# Patient Record
Sex: Female | Born: 1990
Health system: Southern US, Community
[De-identification: ages and names within clinical notes are randomized; demographics above are authoritative.]

## PROBLEM LIST (undated history)

## (undated) ENCOUNTER — Inpatient Hospital Stay (HOSPITAL_COMMUNITY): Payer: Self-pay

## (undated) DIAGNOSIS — Z789 Other specified health status: Secondary | ICD-10-CM

## (undated) HISTORY — DX: Other specified health status: Z78.9

## (undated) HISTORY — PX: CARDIAC SURGERY: SHX584

---
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
## 2012-01-02 ENCOUNTER — Ambulatory Visit (INDEPENDENT_AMBULATORY_CARE_PROVIDER_SITE_OTHER): Payer: 59 | Admitting: Family

## 2012-01-02 VITALS — BP 129/79 | Wt 143.0 lb

## 2012-01-02 DIAGNOSIS — Z3480 Encounter for supervision of other normal pregnancy, unspecified trimester: Secondary | ICD-10-CM

## 2012-01-02 NOTE — Progress Notes (Signed)
No questions or concerns;  Irregular contractions; labor precautions; growth ultrasound for persistent fundal height

## 2012-01-02 NOTE — Progress Notes (Signed)
Routine prenatal check.

## 2012-01-03 ENCOUNTER — Encounter (HOSPITAL_COMMUNITY): Payer: Self-pay | Admitting: *Deleted

## 2012-01-03 ENCOUNTER — Inpatient Hospital Stay (HOSPITAL_COMMUNITY)
Admission: AD | Admit: 2012-01-03 | Discharge: 2012-01-05 | DRG: 775 | Disposition: A | Discharge status: Home / Self Care | Payer: 59 | Source: Ambulatory Visit | Attending: Obstetrics & Gynecology | Admitting: Obstetrics & Gynecology

## 2012-01-03 DIAGNOSIS — IMO0001 Reserved for inherently not codable concepts without codable children: Secondary | ICD-10-CM

## 2012-01-03 DIAGNOSIS — Z3490 Encounter for supervision of normal pregnancy, unspecified, unspecified trimester: Secondary | ICD-10-CM

## 2012-01-03 NOTE — MAU Note (Signed)
SAYS HURT BAD AT 2230.   THINKS SROM AT 2220- CLEAR.   VE - YESTERDAY- 2 CM.  DENIES HSV AND MRSA.

## 2012-01-04 ENCOUNTER — Encounter (HOSPITAL_COMMUNITY): Payer: Self-pay | Admitting: *Deleted

## 2012-01-04 ENCOUNTER — Inpatient Hospital Stay (HOSPITAL_COMMUNITY): Payer: 59 | Admitting: Anesthesiology

## 2012-01-04 ENCOUNTER — Ambulatory Visit (HOSPITAL_COMMUNITY): Admission: RE | Admit: 2012-01-04 | Payer: 59 | Source: Ambulatory Visit

## 2012-01-04 ENCOUNTER — Encounter (HOSPITAL_COMMUNITY): Payer: Self-pay | Admitting: Anesthesiology

## 2012-01-04 LAB — RPR: RPR Ser Ql: NONREACTIVE

## 2012-01-04 LAB — CBC
HCT: 35.7 % — ABNORMAL LOW (ref 36.0–46.0)
Hemoglobin: 12.2 g/dL (ref 12.0–15.0)
MCH: 29.8 pg (ref 26.0–34.0)
MCHC: 34.2 g/dL (ref 30.0–36.0)
MCV: 87.1 fL (ref 78.0–100.0)
Platelets: 242 10*3/uL (ref 150–400)
RBC: 4.1 MIL/uL (ref 3.87–5.11)
RDW: 14 % (ref 11.5–15.5)
WBC: 11.9 10*3/uL — ABNORMAL HIGH (ref 4.0–10.5)

## 2012-01-04 MED ORDER — LACTATED RINGERS IV SOLN: 500.0000 mL | Freq: Once | INTRAVENOUS | Status: DC

## 2012-01-04 MED ORDER — ONDANSETRON HCL 4 MG/2ML IJ SOLN: 4.0000 mg | INTRAMUSCULAR | Status: DC | PRN

## 2012-01-04 MED ORDER — PHENYLEPHRINE 40 MCG/ML (10ML) SYRINGE FOR IV PUSH (FOR BLOOD PRESSURE SUPPORT)
80.0000 ug | PREFILLED_SYRINGE | INTRAVENOUS | Status: DC | PRN
  Filled 2012-01-04: qty 5

## 2012-01-04 MED ORDER — LACTATED RINGERS IV SOLN
INTRAVENOUS | Status: DC
  Administered 2012-01-04 (×3): via INTRAVENOUS

## 2012-01-04 MED ORDER — BENZOCAINE-MENTHOL 20-0.5 % EX AERO
1.0000 "application " | INHALATION_SPRAY | CUTANEOUS | Status: DC | PRN
  Administered 2012-01-04: 1 via TOPICAL
  Filled 2012-01-04: qty 56

## 2012-01-04 MED ORDER — SIMETHICONE 80 MG PO CHEW: 80.0000 mg | CHEWABLE_TABLET | ORAL | Status: DC | PRN

## 2012-01-04 MED ORDER — SODIUM BICARBONATE 8.4 % IV SOLN
INTRAVENOUS | Status: DC | PRN
  Administered 2012-01-04: 3 mL via EPIDURAL

## 2012-01-04 MED ORDER — CITRIC ACID-SODIUM CITRATE 334-500 MG/5ML PO SOLN: 30.0000 mL | ORAL | Status: DC | PRN

## 2012-01-04 MED ORDER — OXYTOCIN 40 UNITS IN LACTATED RINGERS INFUSION - SIMPLE MED
62.5000 mL/h | Freq: Once | INTRAVENOUS | Status: DC
  Filled 2012-01-04: qty 1000

## 2012-01-04 MED ORDER — WITCH HAZEL-GLYCERIN EX PADS: 1.0000 "application " | MEDICATED_PAD | CUTANEOUS | Status: DC | PRN

## 2012-01-04 MED ORDER — ZOLPIDEM TARTRATE 5 MG PO TABS: 5.0000 mg | ORAL_TABLET | Freq: Every evening | ORAL | Status: DC | PRN

## 2012-01-04 MED ORDER — EPHEDRINE 5 MG/ML INJ: 10.0000 mg | INTRAVENOUS | Status: DC | PRN

## 2012-01-04 MED ORDER — LIDOCAINE HCL (PF) 1 % IJ SOLN
30.0000 mL | INTRAMUSCULAR | Status: DC | PRN
  Filled 2012-01-04: qty 30

## 2012-01-04 MED ORDER — LACTATED RINGERS IV SOLN: 500.0000 mL | INTRAVENOUS | Status: DC | PRN

## 2012-01-04 MED ORDER — FENTANYL 2.5 MCG/ML BUPIVACAINE 1/10 % EPIDURAL INFUSION (WH - ANES)
INTRAMUSCULAR | Status: DC | PRN
  Administered 2012-01-04: 12 mL/h via EPIDURAL

## 2012-01-04 MED ORDER — OXYCODONE-ACETAMINOPHEN 5-325 MG PO TABS: 1.0000 | ORAL_TABLET | ORAL | Status: DC | PRN

## 2012-01-04 MED ORDER — ONDANSETRON HCL 4 MG PO TABS: 4.0000 mg | ORAL_TABLET | ORAL | Status: DC | PRN

## 2012-01-04 MED ORDER — IBUPROFEN 600 MG PO TABS
600.0000 mg | ORAL_TABLET | Freq: Four times a day (QID) | ORAL | Status: DC
  Administered 2012-01-04 – 2012-01-05 (×5): 600 mg via ORAL
  Filled 2012-01-04 (×5): qty 1

## 2012-01-04 MED ORDER — ACETAMINOPHEN 325 MG PO TABS: 650.0000 mg | ORAL_TABLET | ORAL | Status: DC | PRN

## 2012-01-04 MED ORDER — OXYTOCIN BOLUS FROM INFUSION
250.0000 mL | Freq: Once | INTRAVENOUS | Status: AC
  Administered 2012-01-04: 250 mL via INTRAVENOUS
  Filled 2012-01-04: qty 500

## 2012-01-04 MED ORDER — ONDANSETRON HCL 4 MG/2ML IJ SOLN
4.0000 mg | Freq: Four times a day (QID) | INTRAMUSCULAR | Status: DC | PRN
  Administered 2012-01-04: 4 mg via INTRAVENOUS
  Filled 2012-01-04: qty 2

## 2012-01-04 MED ORDER — FENTANYL 2.5 MCG/ML BUPIVACAINE 1/10 % EPIDURAL INFUSION (WH - ANES)
14.0000 mL/h | INTRAMUSCULAR | Status: DC
  Administered 2012-01-04: 14 mL/h via EPIDURAL
  Filled 2012-01-04 (×2): qty 60

## 2012-01-04 MED ORDER — TETANUS-DIPHTH-ACELL PERTUSSIS 5-2.5-18.5 LF-MCG/0.5 IM SUSP: 0.5000 mL | Freq: Once | INTRAMUSCULAR | Status: DC

## 2012-01-04 MED ORDER — LANOLIN HYDROUS EX OINT: TOPICAL_OINTMENT | CUTANEOUS | Status: DC | PRN

## 2012-01-04 MED ORDER — SENNOSIDES-DOCUSATE SODIUM 8.6-50 MG PO TABS
2.0000 | ORAL_TABLET | Freq: Every day | ORAL | Status: DC
  Administered 2012-01-04: 2 via ORAL

## 2012-01-04 MED ORDER — DIPHENHYDRAMINE HCL 25 MG PO CAPS: 25.0000 mg | ORAL_CAPSULE | Freq: Four times a day (QID) | ORAL | Status: DC | PRN

## 2012-01-04 MED ORDER — IBUPROFEN 600 MG PO TABS: 600.0000 mg | ORAL_TABLET | Freq: Four times a day (QID) | ORAL | Status: DC | PRN

## 2012-01-04 MED ORDER — EPHEDRINE 5 MG/ML INJ
10.0000 mg | INTRAVENOUS | Status: DC | PRN
  Filled 2012-01-04: qty 4

## 2012-01-04 MED ORDER — DIBUCAINE 1 % RE OINT: 1.0000 "application " | TOPICAL_OINTMENT | RECTAL | Status: DC | PRN

## 2012-01-04 MED ORDER — FLEET ENEMA 7-19 GM/118ML RE ENEM: 1.0000 | ENEMA | RECTAL | Status: DC | PRN

## 2012-01-04 MED ORDER — PHENYLEPHRINE 40 MCG/ML (10ML) SYRINGE FOR IV PUSH (FOR BLOOD PRESSURE SUPPORT): 80.0000 ug | PREFILLED_SYRINGE | INTRAVENOUS | Status: DC | PRN

## 2012-01-04 MED ORDER — PRENATAL MULTIVITAMIN CH
1.0000 | ORAL_TABLET | Freq: Every day | ORAL | Status: DC
  Administered 2012-01-04 – 2012-01-05 (×2): 1 via ORAL
  Filled 2012-01-04 (×2): qty 1

## 2012-01-04 MED ORDER — DIPHENHYDRAMINE HCL 50 MG/ML IJ SOLN: 12.5000 mg | INTRAMUSCULAR | Status: DC | PRN

## 2012-01-04 MED ORDER — NALBUPHINE SYRINGE 5 MG/0.5 ML: 10.0000 mg | INJECTION | INTRAMUSCULAR | Status: DC | PRN

## 2012-01-04 NOTE — H&P (Signed)
Attestation of Attending Supervision of Advanced Practitioner (CNM/NP): Evaluation and management procedures were performed by the Advanced Practitioner under my supervision and collaboration.  I have reviewed the Advanced Practitioner's note and chart, and I agree with the management and plan.  HARRAWAY-SMITH, Edi Gorniak 4:26 AM

## 2012-01-04 NOTE — H&P (Signed)
Adonai Helzer is a 21 y.o. female presenting for eval of ctx and possibly leaking this evening. Denies bldg, reports +FM. Her preg has been followed by the [facility] office and has been essentially unremarkable. Denies H/A, visual disturbances or RUQ pain. History OB History    Grav Para Term Preterm Abortions TAB SAB Ect Mult Living   1              Past Medical History  Diagnosis Date  . No pertinent past medical history    Past Surgical History  Procedure Date  . Cardiac surgery     aas a baby for a hole in the heart   Family History: family history includes Cancer in her father and mother; Diabetes in her father and mother; and Hypertension in her father and mother. Social History:  reports that she has quit smoking. She has never used smokeless tobacco. She reports that she does not drink alcohol or use illicit drugs.   Prenatal Transfer Tool  Maternal Diabetes: No Genetic Screening: Declined Maternal Ultrasounds/Referrals: Normal Fetal Ultrasounds or other Referrals:  None Maternal Substance Abuse:  No Significant Maternal Medications:  None Significant Maternal Lab Results:  Lab values include: Group B Strep negative Other Comments:  None  ROS  Dilation: 4 Effacement (%): 100 Exam by:: Philipp Deputy CNM Blood pressure 156/105, pulse 65, temperature 97.9 F (36.6 C), temperature source Oral, resp. rate 18, height 5\' 3"  (1.6 m), weight 65.431 kg (144 lb 4 oz), last menstrual period 03/18/2011. Maternal Exam:  Uterine Assessment: Ctx q 2-3 mins, mod  Cervix: Cervix evaluated by sterile speculum exam and digital exam.   SSE shows neg pool and neg fern; + copious cervical mucous present Cx 4/100/vtx -4, + memb palp  Physical Exam  Constitutional: She is oriented to person, place, and time. She appears well-developed.  HENT:  Head: Normocephalic.  Cardiovascular: Normal rate.   Respiratory: Effort normal.  Genitourinary: Vagina normal.  Musculoskeletal: Normal  range of motion.  Neurological: She is alert and oriented to person, place, and time.  Skin: Skin is warm and dry.  Psychiatric: She has a normal mood and affect. Her behavior is normal. Thought content normal.    Prenatal labs: ABO, Rh: A/POS/-- (12/05 1456) Antibody: NEG (12/05 1456) Rubella: 75.8 (12/05 1456) RPR: NON REAC (05/10 1130)  HBsAg: NEGATIVE (12/05 1456)  HIV: NON REACTIVE (12/05 1456)  GBS:     Assessment/Plan: IUP at 40.0wks Early labor Elevated BPs  Admit to L&D In light of BP yesterday of 127/70's and no s/s preeclampsia, will reeval BPs once more comfortable and obtain labs prn Expectant management Plans epidural Anticipate SVD   SHAW, KIMBERLY 01/04/2012, 12:49 AM

## 2012-01-04 NOTE — Anesthesia Preprocedure Evaluation (Signed)

## 2012-01-04 NOTE — Anesthesia Procedure Notes (Signed)

## 2012-01-05 MED ORDER — IBUPROFEN 600 MG PO TABS: 600.0000 mg | ORAL_TABLET | Freq: Four times a day (QID) | ORAL | Status: AC

## 2012-01-05 NOTE — Discharge Summary (Signed)
Obstetric Discharge Summary Reason for Admission: onset of labor Prenatal Procedures: none Intrapartum Procedures: spontaneous vaginal delivery Postpartum Procedures: none Complications-Operative and Postpartum: 1st degree perineal laceration Hemoglobin  Date Value Range Status  01/04/2012 12.2  12.0 - 15.0 g/dL Final     HCT  Date Value Range Status  01/04/2012 35.7* 36.0 - 46.0 % Final    Physical Exam:  General: alert and cooperative Lochia: appropriate Uterine Fundus: firm Incision: n/a DVT Evaluation: No evidence of DVT seen on physical exam. Negative Homan's sign. No cords or calf tenderness. No significant calf/ankle edema.  Discharge Diagnoses: Term Pregnancy-delivered  Discharge Information: Date: 01/05/2012 Activity: unrestricted Diet: routine Medications: PNV and Ibuprofen Condition: stable Instructions: refer to practice specific booklet Discharge to: home Follow-up Information    Schedule an appointment as soon as possible for a visit with [facility].   Contact information:   [address], [address] [place] [address] [phone]         Newborn Data: Live born female  Birth Weight: 6 lb 9.8 oz (3000 g) APGAR: 9, 9  Home with mother.  Ashley Gilbert 01/05/2012, 7:15 AM  I have seen and examined this patient and I agree with the above. [name] 7:47 AM 01/05/2012

## 2012-01-05 NOTE — Progress Notes (Signed)
Sw referral received to answer "insurance questions." This Sw told the pt to apply for Medicaid at the [employer] in [place]. Pt has private insurance but wanted to know how to get insurance for the infant. Pt and FOB thanked Sw for information.   

## 2012-01-05 NOTE — Anesthesia Postprocedure Evaluation (Signed)
  Anesthesia Post-op Note  Patient: Ashley Gilbert  Procedure(s) Performed: * No procedures listed *  Patient Location: PACU and Mother/Baby  Anesthesia Type: Epidural  Level of Consciousness: awake, alert  and oriented  Airway and Oxygen Therapy: Patient Spontanous Breathing  Post-op Pain: none  Post-op Assessment: Post-op Vital signs reviewed and Patient's Cardiovascular Status Stable  Post-op Vital Signs: Reviewed and stable  Complications: No apparent anesthesia complications

## 2012-01-06 NOTE — Progress Notes (Signed)
Post discharge chart review completed.  

## 2012-01-09 ENCOUNTER — Encounter: Payer: 59 | Admitting: Obstetrics and Gynecology

## 2012-01-09 NOTE — Discharge Summary (Signed)
Discharge as documented above.

## 2012-02-14 ENCOUNTER — Ambulatory Visit (INDEPENDENT_AMBULATORY_CARE_PROVIDER_SITE_OTHER): Payer: 59 | Admitting: Obstetrics & Gynecology

## 2012-02-14 ENCOUNTER — Encounter: Payer: Self-pay | Admitting: Obstetrics & Gynecology

## 2012-02-14 DIAGNOSIS — Z3009 Encounter for other general counseling and advice on contraception: Secondary | ICD-10-CM

## 2012-02-14 DIAGNOSIS — IMO0002 Reserved for concepts with insufficient information to code with codable children: Secondary | ICD-10-CM

## 2012-02-14 DIAGNOSIS — O99345 Other mental disorders complicating the puerperium: Secondary | ICD-10-CM

## 2012-02-14 DIAGNOSIS — F53 Postpartum depression: Secondary | ICD-10-CM | POA: Insufficient documentation

## 2012-02-14 LAB — POCT URINE PREGNANCY: Preg Test, Ur: NEGATIVE

## 2012-02-14 MED ORDER — SERTRALINE HCL 50 MG PO TABS: 50.0000 mg | ORAL_TABLET | Freq: Every day | ORAL | Status: DC

## 2012-02-14 MED ORDER — SERTRALINE HCL 50 MG PO TABS: 50.0000 mg | ORAL_TABLET | Freq: Every day | ORAL | Status: AC

## 2012-02-14 NOTE — Progress Notes (Signed)
  Subjective:     Ashley Gilbert is a 21 y.o. female who presents for a postpartum visit. She is 6 weeks postpartum following a spontaneous vaginal delivery. I have fully reviewed the prenatal and intrapartum course. The delivery was at 40 gestational weeks. Outcome: spontaneous vaginal delivery. Anesthesia: epidural. Postpartum course has been very good except depression.  Pt has an anxiety in the past.  . Baby's course has been "good". Baby is feeding by bottle - [name]. Bleeding bleeding after unprotected sex at 5 weeks pp. Bowel function is normal. Bladder function is normal. Patient is sexually active. Contraception method is coitus interruptus. Postpartum depression screening: positive.  The following portions of the patient's history were reviewed and updated as appropriate: allergies, current medications, past family history, past medical history, past social history, past surgical history and problem list.  Review of Systems Musculoskeletal:positive for back pain   Objective:    Breastfeeding? Unknown  General:  alert, cooperative and no distress   Breasts:  inspection negative, no nipple discharge or bleeding, no masses or nodularity palpable  Lungs: clear to auscultation bilaterally  Heart:  regular rate and rhythm  Abdomen: soft, non-tender; bowel sounds normal; no masses,  no organomegaly   Vulva:  positive for bump on right labia majora, will look at in 2 weeks when placing IUD.  Does not appear to be HSV.  Nontender.  Vagina: normal vagina  Cervix:  no cervical motion tenderness  Corpus: normal size, contour, position, consistency, mobility, non-tender  Adnexa:  no mass, fullness, tenderness  Rectal Exam: Not performed.        Assessment:    nml postpartum exam. Pt agrees to Zoloft for post partum depression.  If Zoloft doesn't work will refer to pscyh due to anxiety..   Plan:    1. Contraception: IUD 2. Has had unprotected sex so will do pregnancy test today,  abstinence for 2 weeks, then IUD Mirena. 3. Follow up in: 2 weeks or as needed.

## 2012-02-24 ENCOUNTER — Ambulatory Visit: Payer: 59

## 2012-02-27 ENCOUNTER — Ambulatory Visit: Payer: 59 | Admitting: Advanced Practice Midwife

## 2013-02-18 IMAGING — US US OB DETAIL+14 WK
1 series · 16 of 28 positions shown · non-contrast
Comparison: none

[Series 1: us ob detail +14 wk · 16 of 80 slices shown]
[im 1/80]
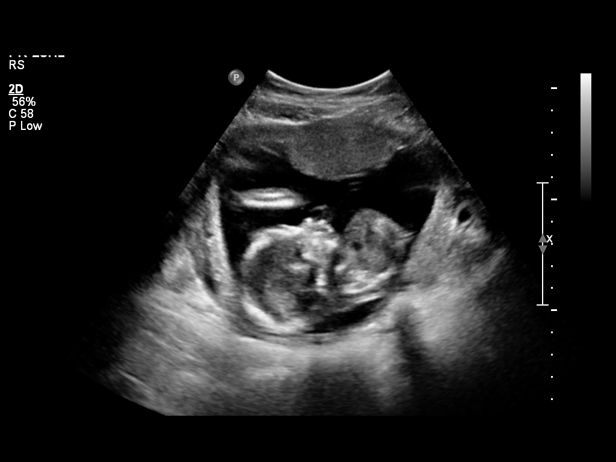
[im 6/80]
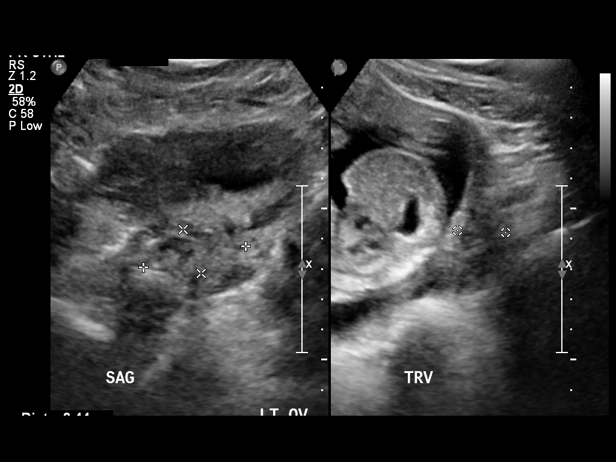
[im 12/80]
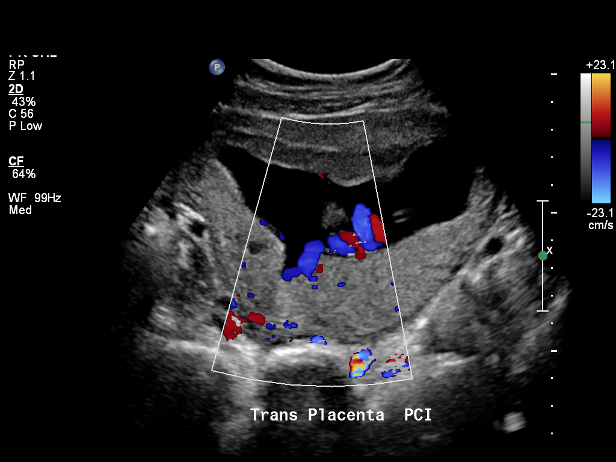
[im 18/80]
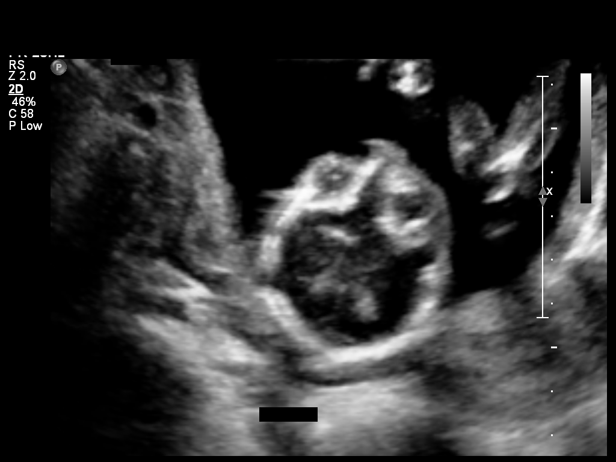
[im 21/80]
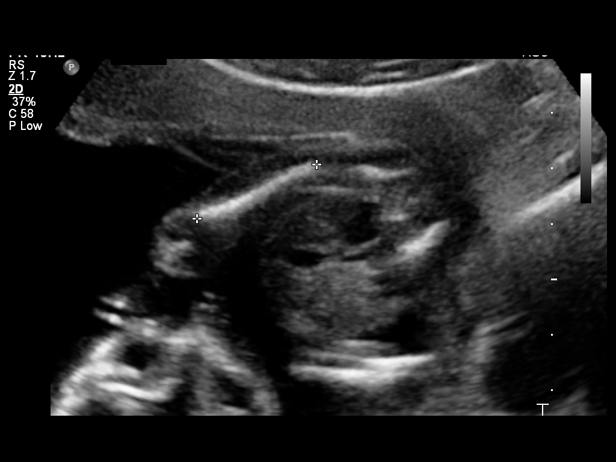
[im 27/80]
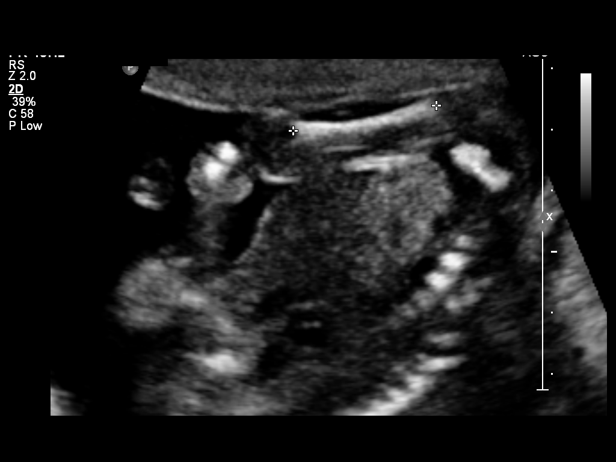
[im 33/80]
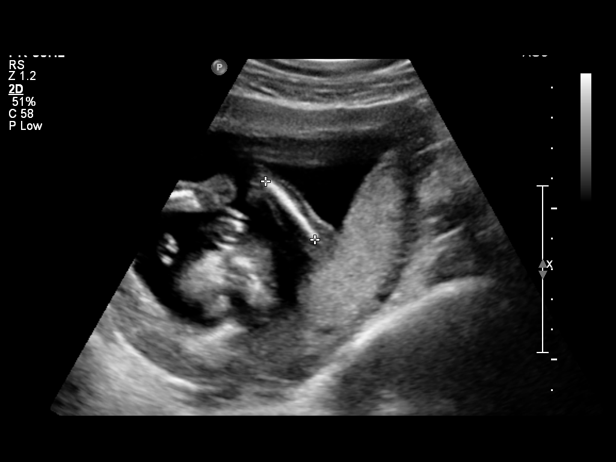
[im 39/80]
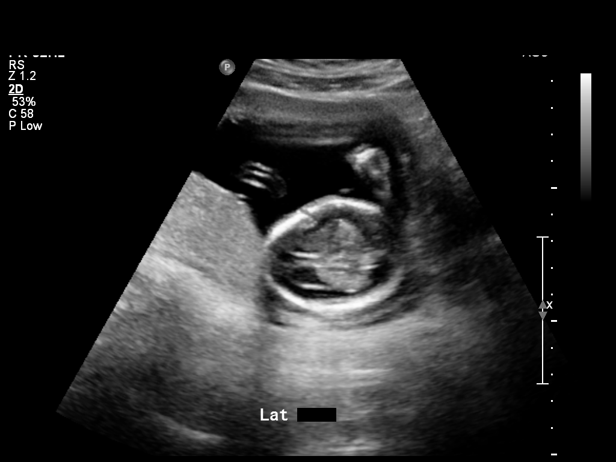
[im 41/80]
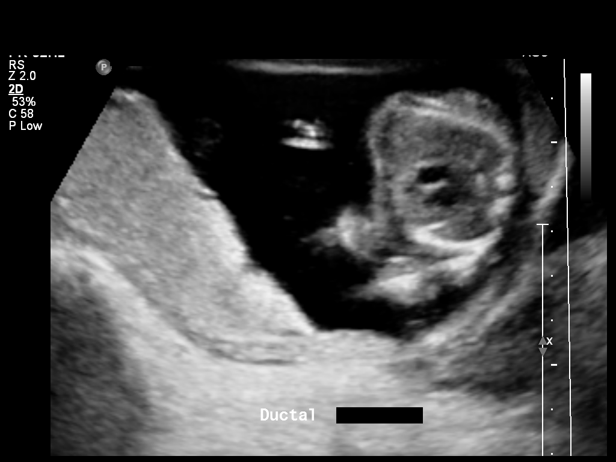
[im 47/80]
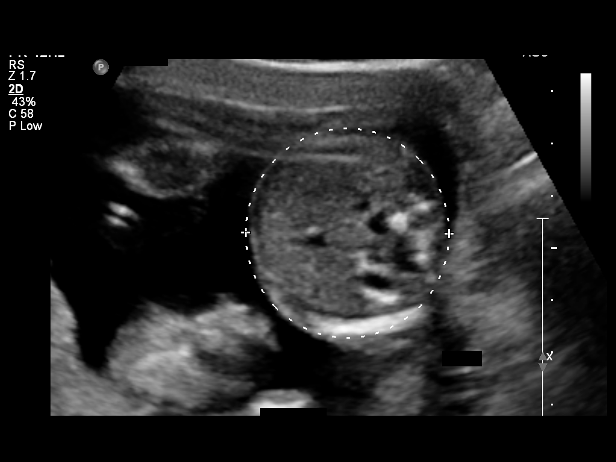
[im 53/80]
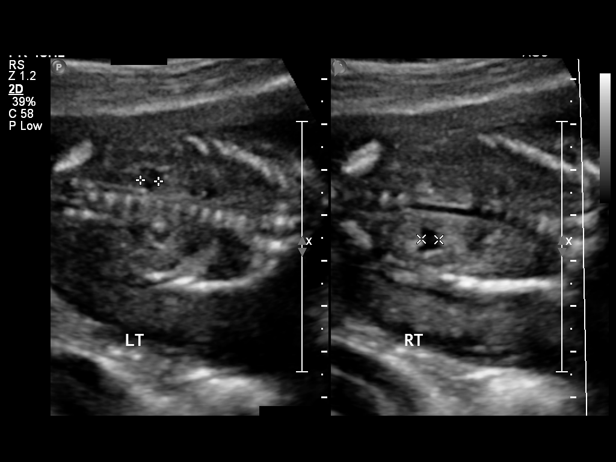
[im 59/80]
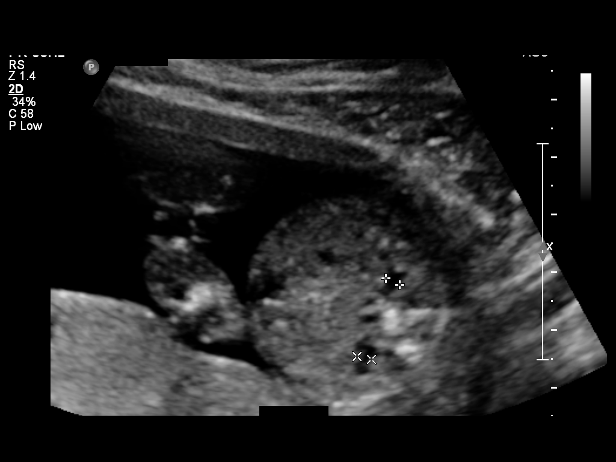
[im 62/80]
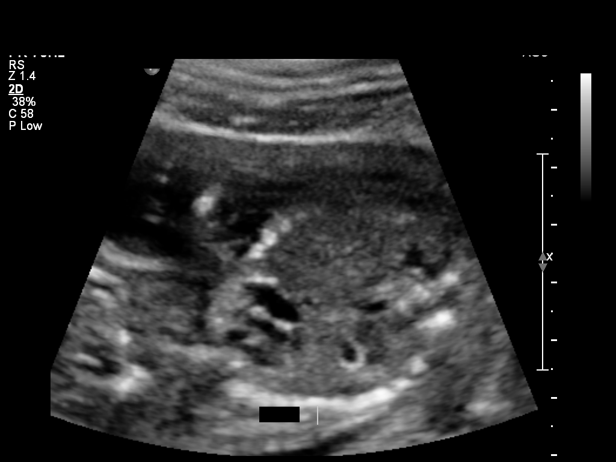
[im 68/80]
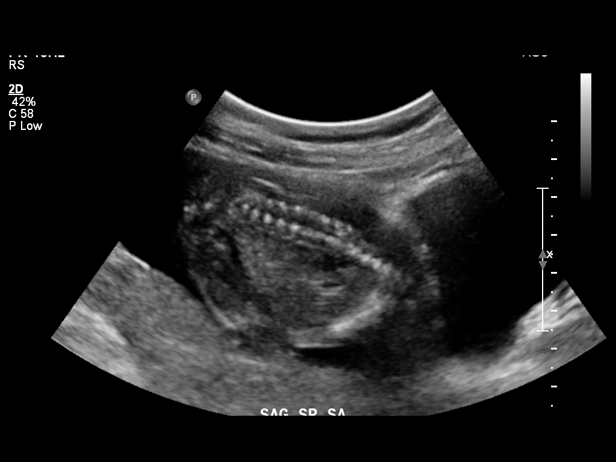
[im 74/80]
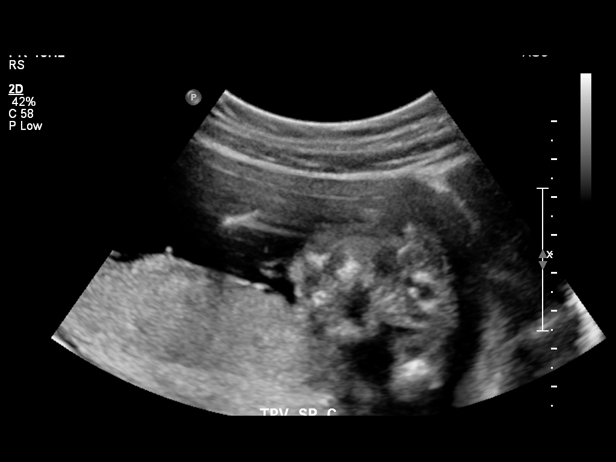
[im 80/80]
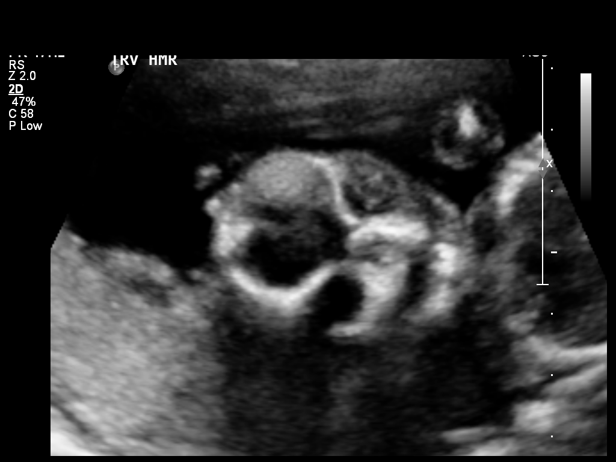

[16 of 28 positions shown; findings below may reference images not displayed]

OBSTETRICS REPORT
                      (Signed Final 08/02/2011 [DATE])

                 CNM
 Order#:         [id number]
Procedures

 US OB DETAIL + 14 WK                                  76811.0
Indications

 Detailed fetal anatomic survey
Fetal Evaluation

 Fetal Heart Rate:  163                         bpm
 Cardiac Activity:  Observed
 Presentation:      Variable
 Placenta:          Fundal, above cervical os
 P. Cord            Visualized, central
 Insertion:

 Amniotic Fluid
 AFI FV:      Subjectively within normal limits
                                             Larg Pckt:   3.53   cm
Biometry

 BPD:     39.5  mm    G. Age:   18w 0d                CI:        72.04   70 - 86
                                                      FL/HC:      16.6   15.8 -
                                                                         18
 HC:     148.1  mm    G. Age:   17w 6d       46  %    HC/AC:      1.20   1.07 -

 AC:     123.8  mm    G. Age:   18w 0d       53  %    FL/BPD:
 FL:      24.6  mm    G. Age:   17w 3d       29  %    FL/AC:      19.9   20 - 24
 HUM:     24.9  mm    G. Age:   17w 6d       53  %
 CER:     17.7  mm    G. Age:   17w 5d       46  %
 NFT:     3.97  mm

 Est. FW:     209  gm      0 lb 7 oz     48  %
Gestational Age

 LMP:           19w 4d       Date:   03/18/11                 EDD:   12/23/11
 U/S Today:     17w 6d                                        EDD:   01/04/12
 Best:          17w 6d    Det. By:   U/S (08/02/11)           EDD:   01/04/12
Genetic Sonogram - Trisomy 21 Screening
 Age:                                             20          Risk=1:   885
 Echogenic bowel:                                 No          LR :
 Hypoplastic/absent Nasal bone:                   No
 Choroid plexus cysts:                            No
 Structural anomalies (inc. cardiac):             No          LR :
 Hypoplastic / absent midphalanx 5th Digit:       No
 Short femur:                                     No          LR :
 Wide space 4st-0nd toes:                         No
 Short humerus:                                   No          LR :
 2-vessel umbilical cord:                         No
 Pyelectasis:                                     No          LR :
 Echogenic cardiac foci:                          No          LR :
 Nuchal fold thickening >= 6 mm:                  No          LR :

 12 Of 12 Criteria Were Visualized and 0 Abnormal(s) Were Seen.
 Ultrasound Modified Risk for Fetal Down Syndrome = [DATE]
Anatomy

 Cranium:           Appears normal      Aortic Arch:       Not well
                                                           visualized
 Fetal Cavum:       Appears normal      Ductal Arch:       Not well
                                                           visualized
 Ventricles:        Appears normal      Diaphragm:         Appears normal
 Choroid Plexus:    Appears normal      Stomach:           Appears normal
 Cerebellum:        Appears normal      Abdomen:           Appears normal
 Posterior Fossa:   Appears normal      Abdominal Wall:    Appears nml
                                                           (cord insert,
                                                           abd wall)
 Nuchal Fold:       Appears normal      Cord Vessels:      Appears normal
                    (neck, nuchal                          (3 vessel cord)
                    fold)
 Face:              Appears normal      Kidneys:           Appear normal
                    (lips/profile/orbit
                    s)
 Heart:             Appears normal      Bladder:           Appears normal
                    (4 chamber &
                    axis)
 RVOT:              Appears normal      Spine:             Appears normal
 LVOT:              Appears normal      Limbs:             Appears normal
                                                           (hands, ankles,
                                                           feet)

 Other:     Fetus appears to be a female. Heels and 5th digit
            visualized.
Targeted Anatomy

 Fetal Central Nervous System
 Lat. Ventricles:   5.2                 Cisterna Magna:
Cervix Uterus Adnexa

 Cervical Length:   4         cm

 Cervix:       Normal appearance by transabdominal scan.

 Left Ovary:   Normal, measuring 3.4 x 1.6 x 1.6cm.
 Right Ovary:  Not visualized.
 Adnexa:     No abnormality visualized.
Impression

  Single living intrauterine gestation with concordant  fetal
 indices and normal visualized anatomy. Today's sonographic
 EGA is 12 days younger than estimated by LMP.  No
 sonographic markers for aneuploidy visualized;  see above
 for US modified Trisomy 21 risk calculation.

## 2013-06-18 IMAGING — US US OB FOLLOW-UP
1 series · 12 of 28 positions shown · non-contrast
Comparison: none

[Series 1: us ob follow up · 39 acquisitions, 12 frames shown]
[im 2/39]
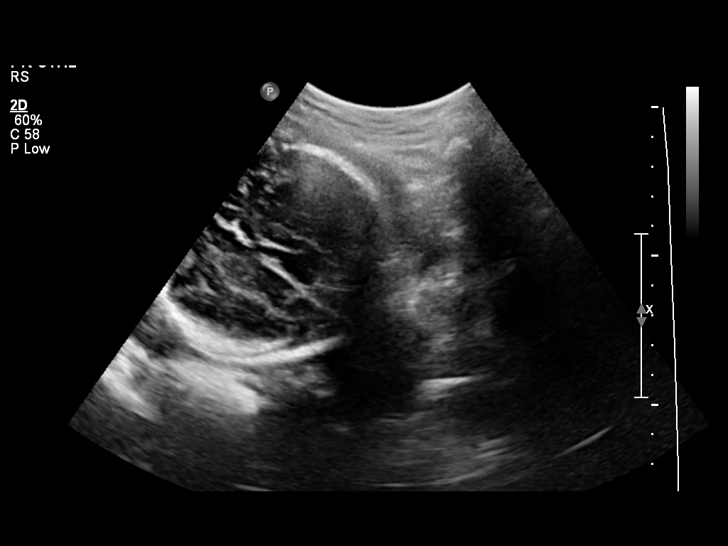
[im 5/39]
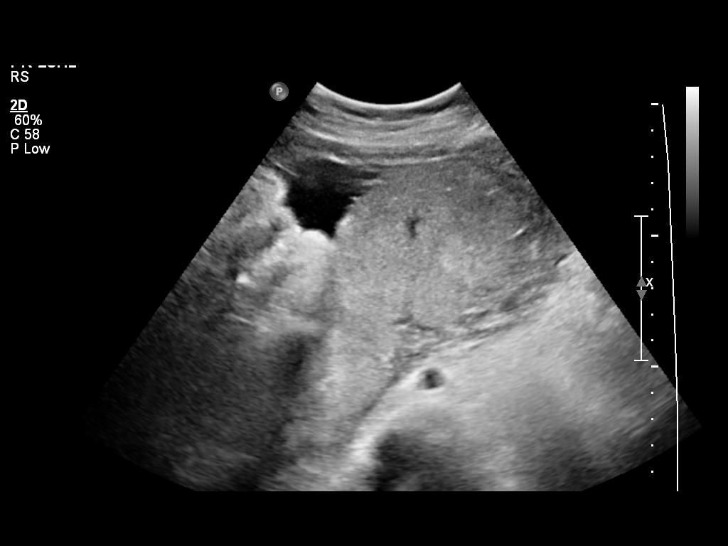
[im 8/39]
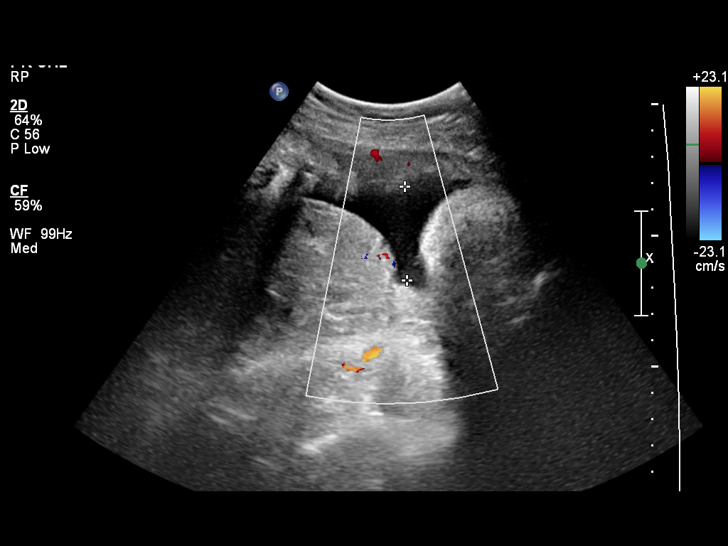
[im 12/39]
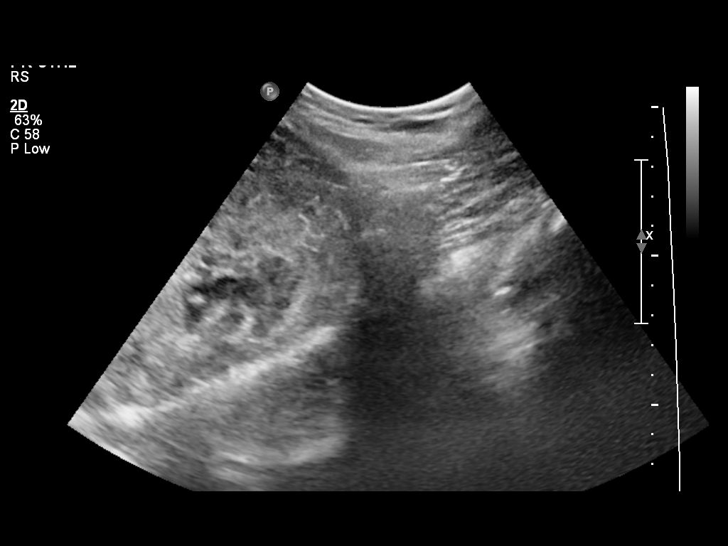
[im 15/39]
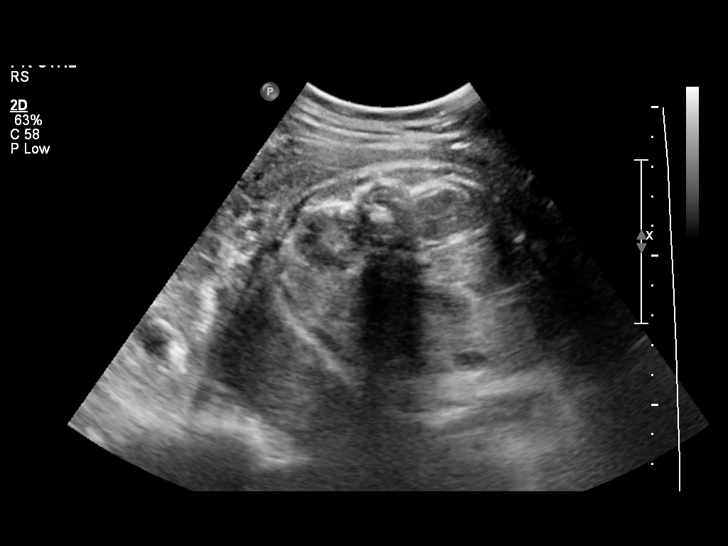
[im 17/39]
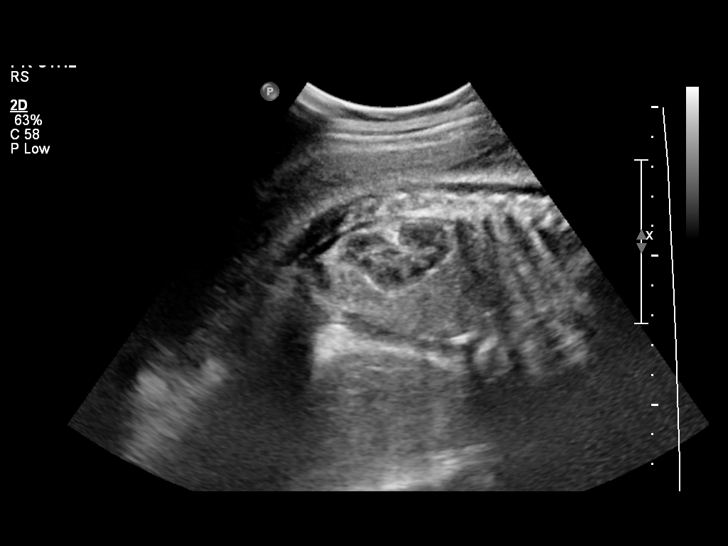
[im 22/39]
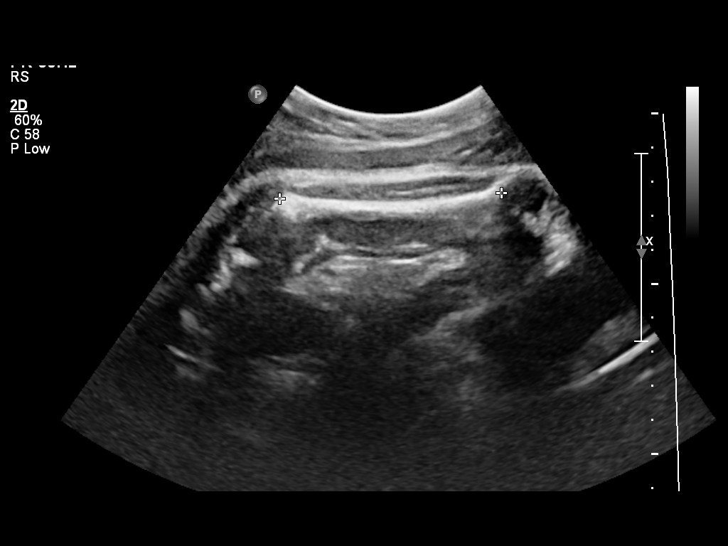
[im 24/39]
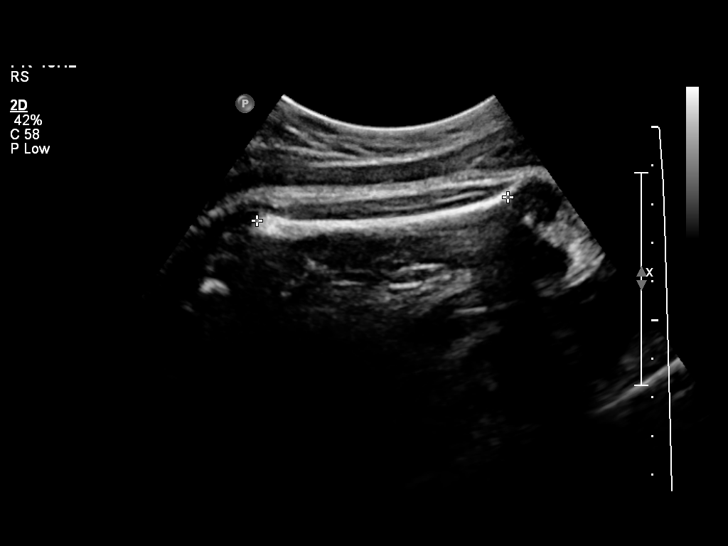
[im 27/39]
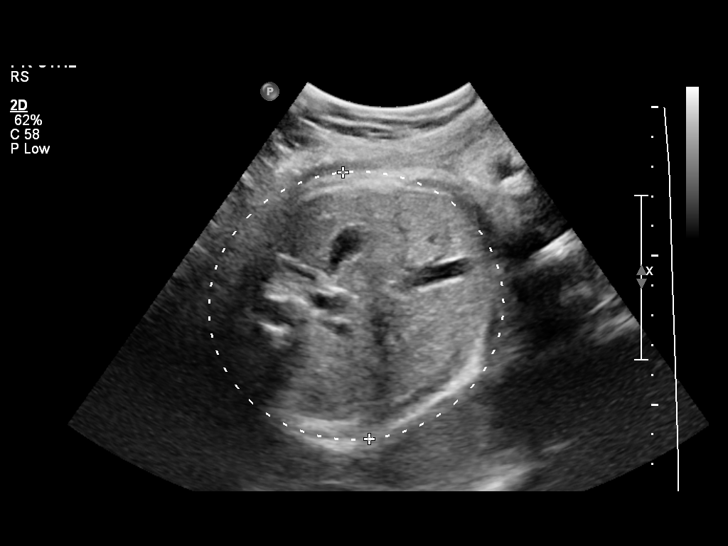
[im 31/39]
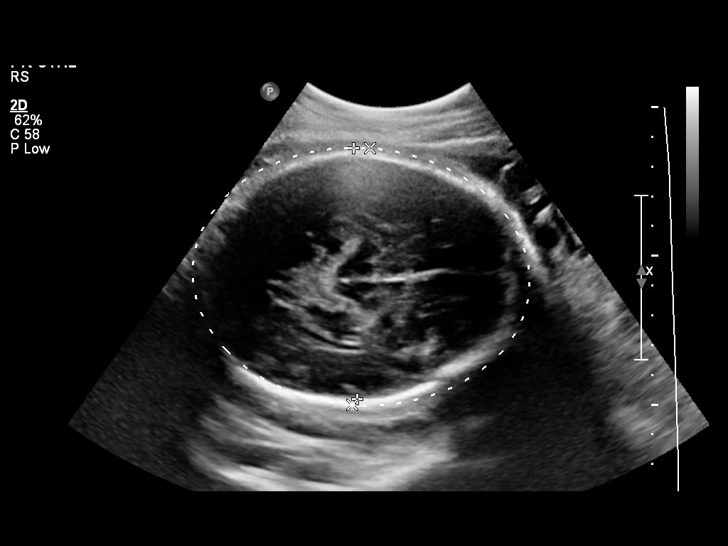
[im 34/39]
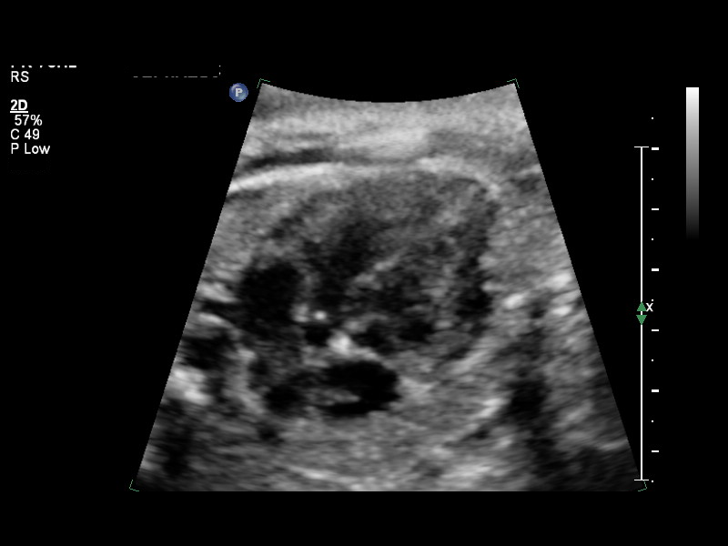
[im 37/39]
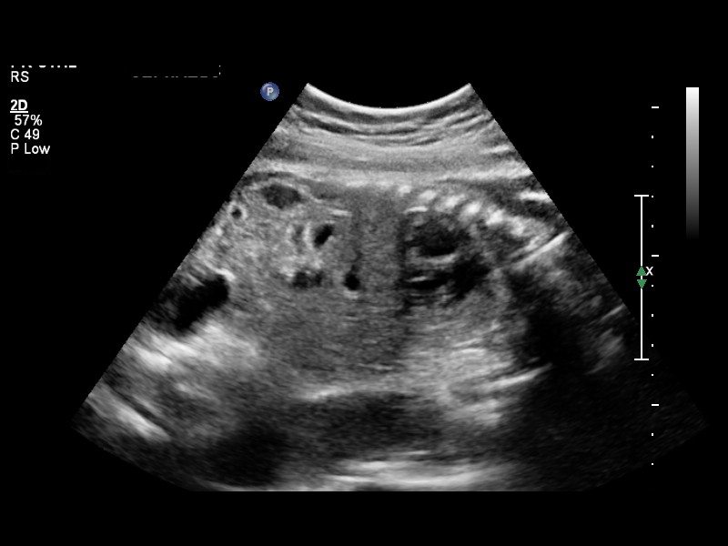

[12 of 28 positions shown; findings below may reference images not displayed]

OBSTETRICS REPORT
                      (Signed Final 11/30/2011 [DATE])

 Order#:         [id number]
Procedures

 US OB FOLLOW UP                                       76816.1
Indications

 Size less than dates (Small for gestational [AGE]
 FGR)
 Assess Fetal Growth / Estimated Fetal Weight
Fetal Evaluation

 Fetal Heart Rate:  152                         bpm
 Cardiac Activity:  Observed
 Presentation:      Cephalic
 Placenta:          Anterior Fundal, above
                    cervical os
 P. Cord            Previously Visualized
 Insertion:

 Amniotic Fluid
 AFI FV:      Subjectively within normal limits
 AFI Sum:     10.2    cm      22   %Tile     Larg Pckt:   4.51   cm
 RLQ:   4.51   cm    LUQ:    3.56   cm    LLQ:   2.13    cm
Biometry

 BPD:     83.4  mm    G. Age:   33w 4d                CI:        70.68   70 - 86
                                                      FL/HC:      20.5   20.1 -

 HC:     316.2  mm    G. Age:   35w 4d       29  %    HC/AC:      1.08   0.93 -

 AC:     292.2  mm    G. Age:   33w 2d       12  %    FL/BPD:     77.7   71 - 87
 FL:      64.8  mm    G. Age:   33w 3d       10  %    FL/AC:      22.2   20 - 24

 Est. FW:    5578  gm    4 lb 14 oz      32  %
Gestational Age

 LMP:           36w 5d       Date:   03/18/11                 EDD:   12/23/11
 U/S Today:     34w 0d                                        EDD:   01/11/12
 Best:          35w 0d    Det. By:   U/S (08/02/11)           EDD:   01/04/12
Anatomy
 Cranium:           Appears normal      Aortic Arch:       Appears normal
 Fetal Cavum:       Appears normal      Ductal Arch:       Not well
                                                           visualized
 Ventricles:        Appears normal      Diaphragm:         Appears normal
 Choroid Plexus:    Previously seen     Stomach:           Appears normal
 Cerebellum:        Previously seen     Abdomen:           Appears normal
 Posterior Fossa:   Previously seen     Abdominal Wall:    Previously seen
 Nuchal Fold:       Previously seen     Cord Vessels:      Previously seen
 Face:              Previously seen     Kidneys:           Appear normal
 Heart:             Appears normal      Bladder:           Appears normal
                    (4 chamber &
                    axis)
 RVOT:              Previously seen     Spine:             Previously seen
 LVOT:              Previously seen     Limbs:             Previously seen

 Other:     Fetus appears to be a female. Heels and 5th digit
            previously seen.
Targeted Anatomy

 Fetal Central Nervous System
 Lat. Ventricles:
Cervix Uterus Adnexa

 Cervix:       Not visualized (advanced GA >34 wks)
 Left Ovary:   Not visualized.
 Right Ovary:  Not visualized.

 Adnexa:     No abnormality visualized.
Impression

 Assigned GA is currently 35w 0d.   Fetal growth curve shows
 interval decline, with EFW currently at  32 %ile, and AC at 12
 %ile.
 Amniotic fluid within normal limits, with AFI of 10.2 cm.
Recommendations

 Consider additional antenatal testing, and followup US for
 growth in 2-3 wks.

## 2013-07-08 IMAGING — US US OB FOLLOW-UP
1 series · 13 of 28 positions shown · non-contrast
Comparison: none

[Series 1: us ob follow up · 49 acquisitions, 13 frames shown]
[im 2/49]
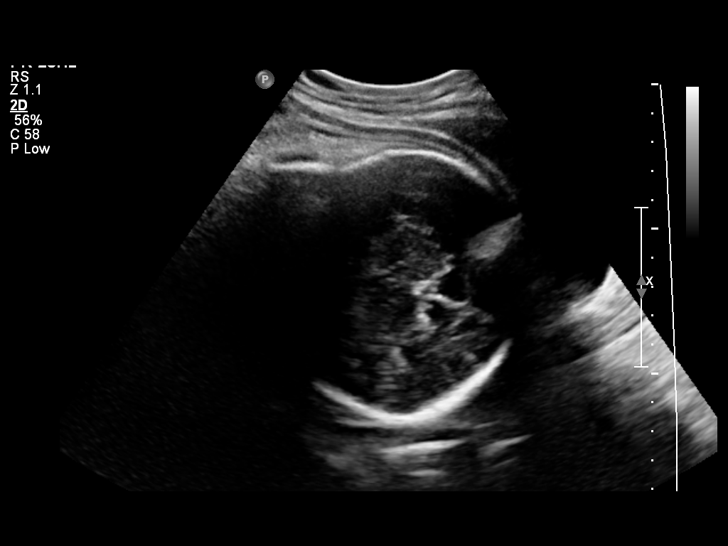
[im 6/49]
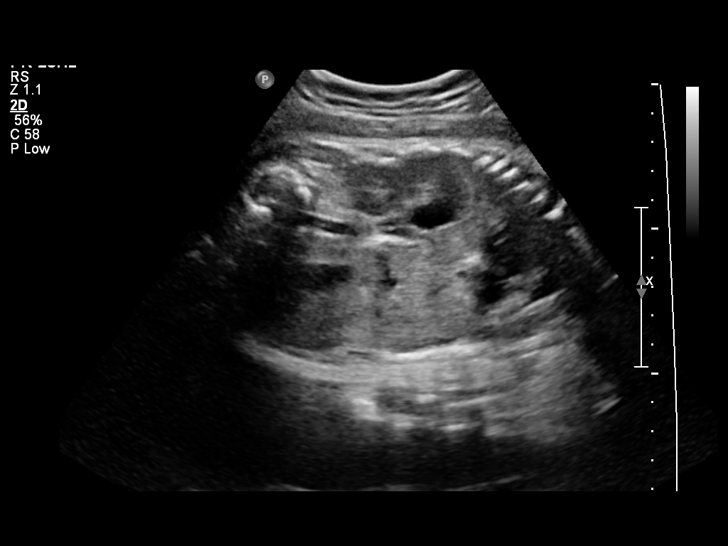
[im 9/49]
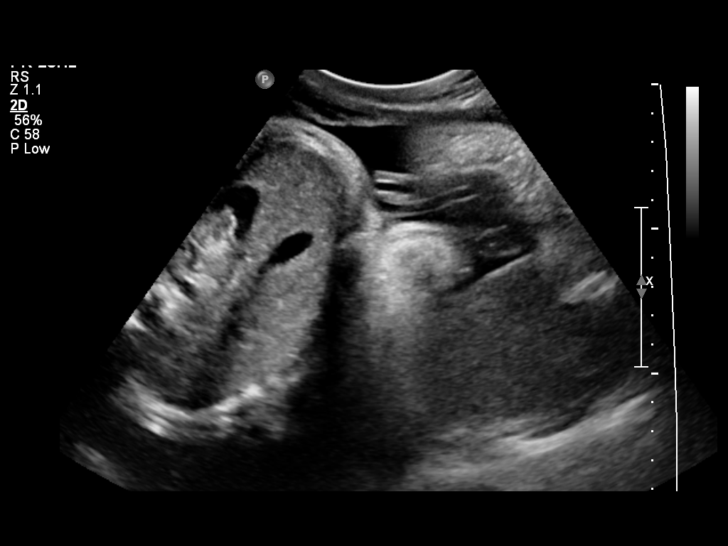
[im 13/49]
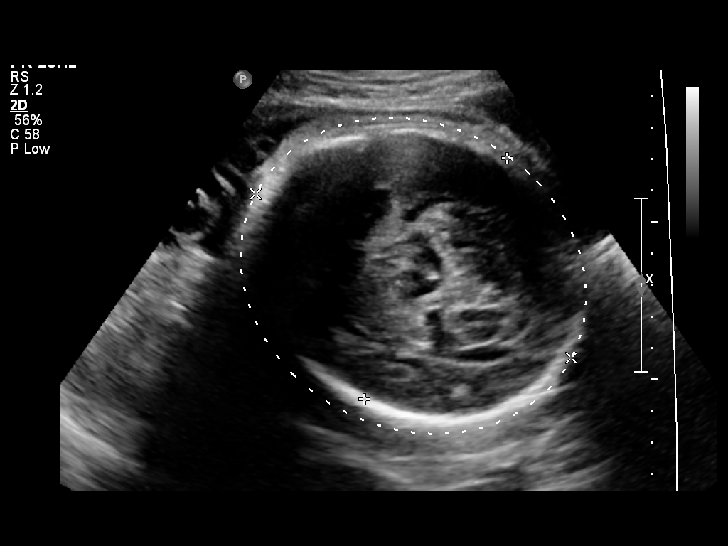
[im 17/49]
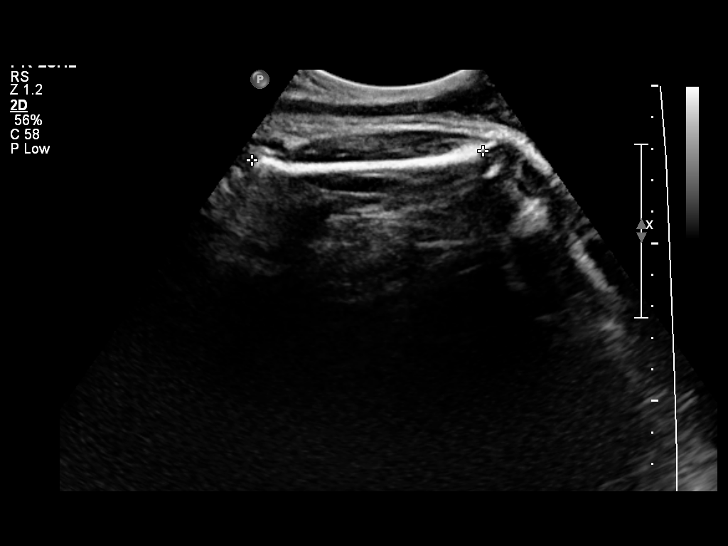
[im 20/49]
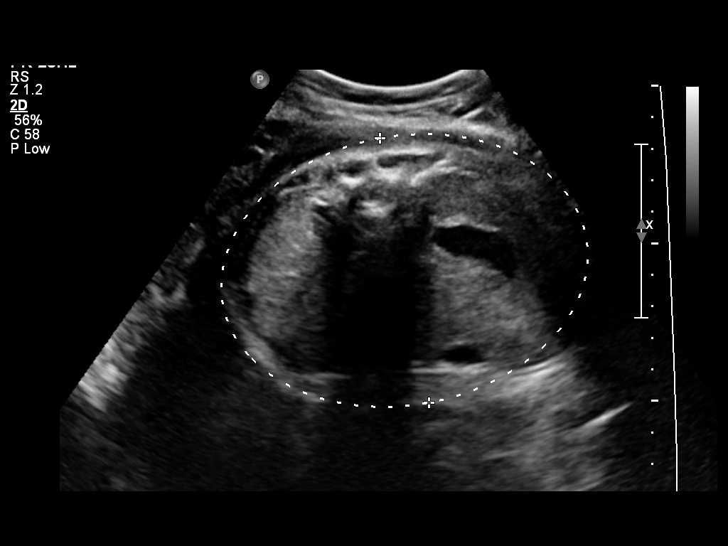
[im 25/49]
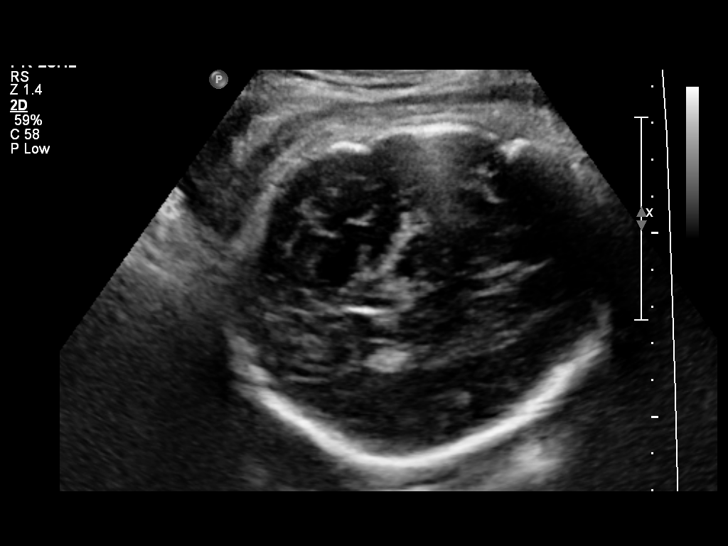
[im 29/49]
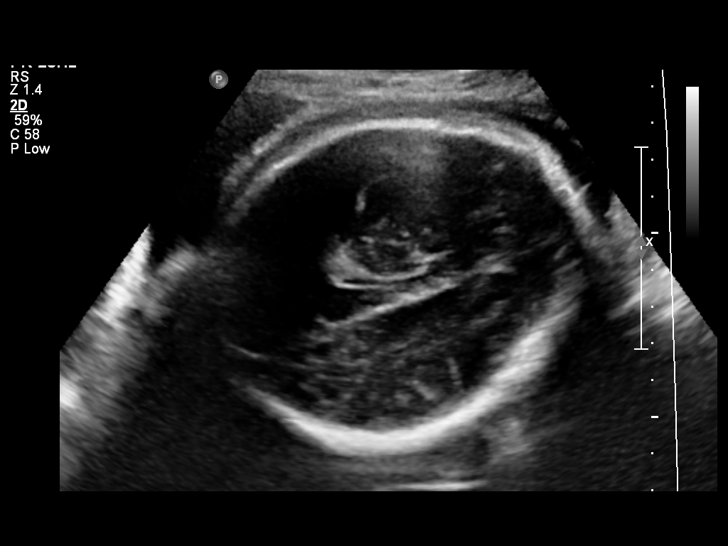
[im 33/49]
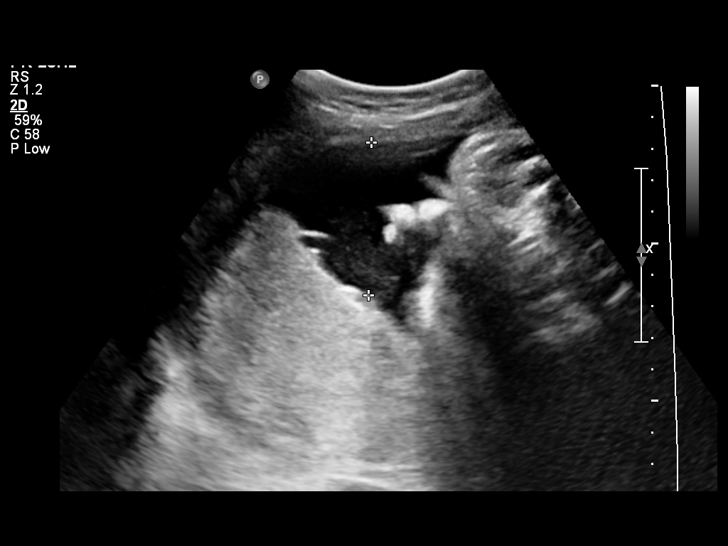
[im 36/49]
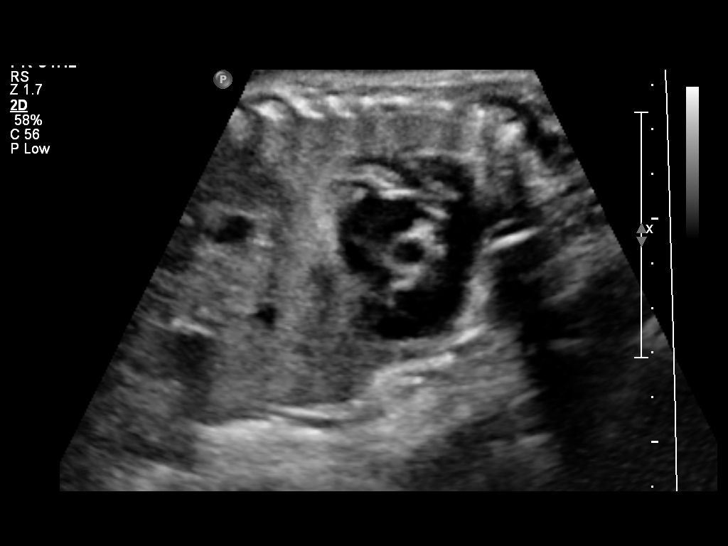
[im 40/49]
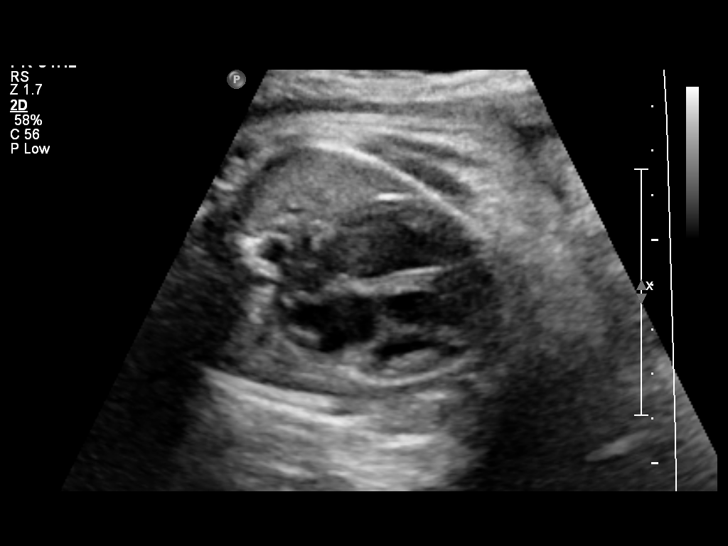
[im 43/49]
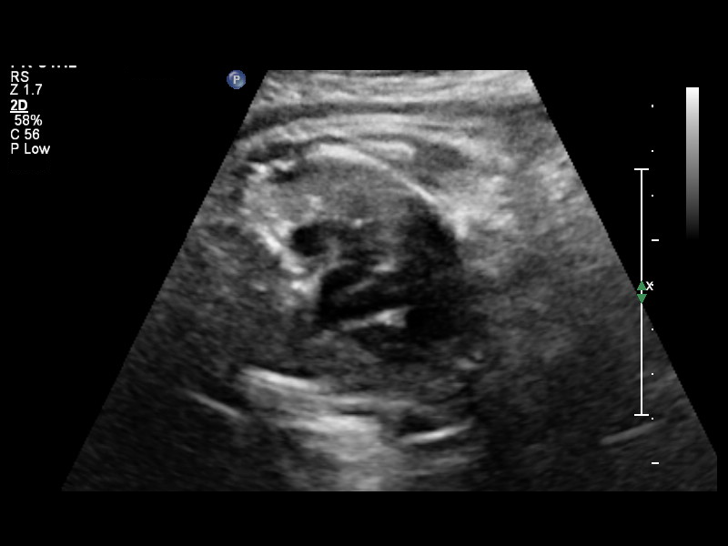
[im 47/49]
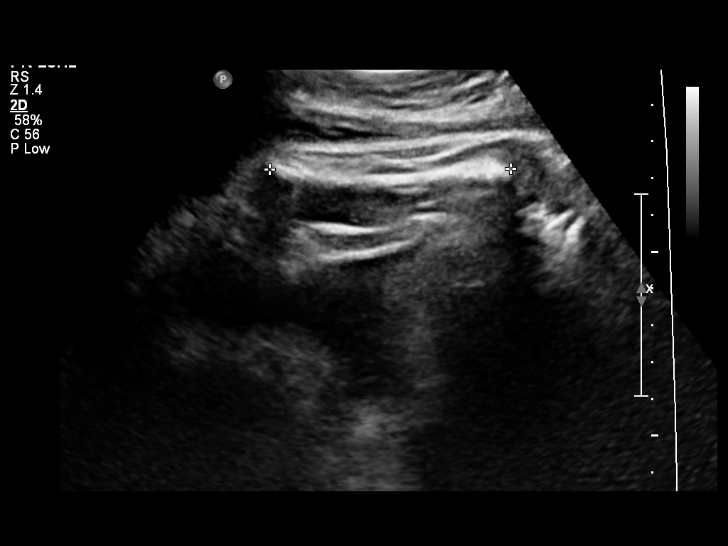

[13 of 28 positions shown; findings below may reference images not displayed]

OBSTETRICS REPORT
                      (Signed Final 12/20/2011 [DATE])

                 CNM
 Order#:         [id number]
Procedures

 US OB FOLLOW UP                                       76816.1
Indications

 Size less than dates (Small for gestational [AGE]
 FGR)
 Assess Fetal Growth / Estimated Fetal Weight
Fetal Evaluation

 Fetal Heart Rate:  169                         bpm
 Cardiac Activity:  Observed
 Presentation:      Cephalic
 Placenta:          Posterior, above cervical
                    os

 Amniotic Fluid
 AFI FV:      Subjectively low-normal
 AFI Sum:     9.71    cm      23   %Tile     Larg Pckt:   4.83   cm
 RLQ:   4.83   cm    LUQ:    1.19   cm    LLQ:   3.69    cm
Biometry

 BPD:     88.5  mm    G. Age:   35w 6d                CI:        73.11   70 - 86
                                                      FL/HC:      21.7   20.9 -

 HC:       329  mm    G. Age:   37w 3d       21  %    HC/AC:      1.02   0.92 -

 AC:     321.4  mm    G. Age:   36w 0d       19  %    FL/BPD:     80.6   71 - 87
 FL:      71.3  mm    G. Age:   36w 4d       21  %    FL/AC:      22.2   20 - 24
 HUM:     65.3  mm    G. Age:   37w 6d       77  %

 Est. FW:    5780  gm      6 lb 6 oz     40  %
Gestational Age

 LMP:           39w 4d       Date:   03/18/11                 EDD:   12/23/11
 U/S Today:     36w 3d                                        EDD:   01/14/12
 Best:          37w 6d    Det. By:   U/S (08/02/11)           EDD:   01/04/12
Anatomy
 Cranium:           Appears normal      Aortic Arch:       Appears normal
 Fetal Cavum:       Previously seen     Ductal Arch:       Appears normal
 Ventricles:        Appears normal      Diaphragm:         Appears normal
 Choroid Plexus:    Appears normal      Stomach:           Appears normal
 Cerebellum:        Appears normal      Abdomen:           Previously seen
 Posterior Fossa:   Appears normal      Abdominal Wall:    Previously seen
 Nuchal Fold:       Previously seen     Cord Vessels:      Previously seen
 Face:              Appears normal      Kidneys:           Appear normal
                    (lips/profile/orbit
                    s)
 Heart:             Appears normal      Bladder:           Appears normal
                    (4 chamber &
                    axis)
 RVOT:              Appears normal      Spine:             Previously seen
 LVOT:              Appears normal      Limbs:             Previously seen

 Other:     Fetus appears to be a female. Heels and 5th digit
            previously seen.
Cervix Uterus Adnexa

 Cervix:       Not visualized (advanced GA >34 wks)
 Left Ovary:   Not visualized.
 Right Ovary:  Not visualized.
Impression

  Single living intrauterine gestation in cephalic presentation
 with concordant gestational age and fetal indices. cbd The
 EFW today is at the 40th percentile.   Normal growth and
 fluid.

## 2013-12-11 ENCOUNTER — Emergency Department
Admission: EM | Admit: 2013-12-11 | Discharge: 2013-12-11 | Disposition: A | Discharge status: Home / Self Care | Payer: 59 | Source: Home / Self Care

## 2013-12-11 ENCOUNTER — Encounter: Payer: Self-pay | Admitting: Emergency Medicine

## 2013-12-11 DIAGNOSIS — R300 Dysuria: Secondary | ICD-10-CM

## 2013-12-11 DIAGNOSIS — N3000 Acute cystitis without hematuria: Secondary | ICD-10-CM

## 2013-12-11 LAB — POCT URINALYSIS DIP (MANUAL ENTRY)
Glucose, UA: 100
Nitrite, UA: POSITIVE
Protein Ur, POC: 100
Spec Grav, UA: 1.02 (ref 1.005–1.03)
Urobilinogen, UA: 4 (ref 0–1)
pH, UA: 5.5 (ref 5–8)

## 2013-12-11 MED ORDER — NITROFURANTOIN MONOHYD MACRO 100 MG PO CAPS: 100.0000 mg | ORAL_CAPSULE | Freq: Two times a day (BID) | ORAL | Status: AC

## 2013-12-11 NOTE — ED Provider Notes (Signed)
CSN: [id number]     Arrival date & time 12/11/13  1725 History   None    Chief Complaint  Patient presents with  . Dysuria      HPI Comments: Patient complains of three day history of dysuia, frequency, and nocturia.  No abdominal or pelvic pain, and no fevers, chills, and sweats.  Patient is a 23 y.o. female presenting with dysuria. The history is provided by the patient.  Dysuria Pain quality:  Burning Pain severity:  Mild Onset quality:  Sudden Duration:  3 days Timing:  Constant Progression:  Unchanged Chronicity:  New Recent urinary tract infections: no   Relieved by:  Phenazopyridine Worsened by:  Nothing tried Ineffective treatments:  None tried Urinary symptoms: frequent urination and hesitancy   Urinary symptoms: no discolored urine, no foul-smelling urine, no hematuria and no bladder incontinence   Associated symptoms: no abdominal pain, no fever, no flank pain, no genital lesions, no nausea, no vaginal discharge and no vomiting     Past Medical History  Diagnosis Date  . No pertinent past medical history    Past Surgical History  Procedure Laterality Date  . Cardiac surgery      aas a baby for a hole in the heart   Family History  Problem Relation Age of Onset  . Diabetes Mother   . Hypertension Mother   . Cancer Mother   . Diabetes Father   . Hypertension Father   . Cancer Father   . Heart disease Father    History  Substance Use Topics  . Smoking status: Former Research scientist (life sciences)  . Smokeless tobacco: Never Used  . Alcohol Use: No   OB History   Grav Para Term Preterm Abortions TAB SAB Ect Mult Living   1 1 1  0 0 0 0 0 0 1     Review of Systems  Constitutional: Negative for fever.  Gastrointestinal: Negative for nausea, vomiting and abdominal pain.  Genitourinary: Positive for dysuria. Negative for flank pain and vaginal discharge.  All other systems reviewed and are negative.   Allergies  Review of patient's allergies indicates no known  allergies.  Home Medications   Prior to Admission medications   Medication Sig Start Date End Date Taking? Authorizing Provider  levonorgestrel (MIRENA) 20 MCG/24HR IUD 1 each by Intrauterine route once.   Yes Historical Provider, MD  nitrofurantoin, macrocrystal-monohydrate, (MACROBID) 100 MG capsule Take 1 capsule (100 mg total) by mouth 2 (two) times daily. Take with food. 12/11/13   Kandra Nicolas, MD  Prenatal Vit-Fe Fumarate-FA (PRENATAL MULTIVITAMIN) TABS Take 1 tablet by mouth daily.    Historical Provider, MD  sertraline (ZOLOFT) 50 MG tablet Take 1 tablet (50 mg total) by mouth daily. 02/14/12 02/13/13  Guss Bunde, MD   BP 118/78  Pulse 71  Temp(Src) 98.1 F (36.7 C) (Oral)  Resp 16  Ht 5\' 2"  (1.575 m)  Wt 149 lb (67.586 kg)  BMI 27.25 kg/m2  SpO2 98% Physical Exam Nursing notes and Vital Signs reviewed. Appearance:  Patient appears healthy, stated age, and in no acute distress Eyes:  Pupils are equal, round, and reactive to light and accomodation.  Extraocular movement is intact.  Conjunctivae are not inflamed  Pharynx:  Normal; moist mucous membranes   Neck:  Supple.  No adenopathy Lungs:  Clear to auscultation.  Breath sounds are equal.  Heart:  Regular rate and rhythm without murmurs, rubs, or gallops.  Abdomen:  Nontender without masses or hepatosplenomegaly.  Bowel sounds  are present.  No CVA or flank tenderness.  Extremities:  No edema.  No calf tenderness Skin:  No rash present.   ED Course  Procedures  none    Labs Reviewed  URINE CULTURE  POCT URINALYSIS DIP (MANUAL ENTRY):  GLU 100mg /dL; BIL moderate; KET trace; BLO small; PRO 100mg /dL; URO 4.0E.U./dL; NIT positive; LEU large Note that urinalysis dipstick results may be inaccurate because of patient's use of AZO          MDM   1. Dysuria   2. Acute cystitis without hematuria    Urine culture pending.  Begin Macrobid Increase fluid intake.  May continue AZO for 2 more days, then discontinue. If  symptoms become significantly worse during the night or over the weekend, proceed to the local emergency room.    Kandra Nicolas, MD 12/13/13 [id number]

## 2013-12-11 NOTE — Discharge Instructions (Signed)
Increase fluid intake.  May continue AZO for 2 more days, then discontinue. If symptoms become significantly worse during the night or over the weekend, proceed to the local emergency room.    Urinary Tract Infection Urinary tract infections (UTIs) can develop anywhere along your urinary tract. Your urinary tract is your body's drainage system for removing wastes and extra water. Your urinary tract includes two kidneys, two ureters, a bladder, and a urethra. Your kidneys are a pair of bean-shaped organs. Each kidney is about the size of your fist. They are located below your ribs, one on each side of your spine. CAUSES Infections are caused by microbes, which are microscopic organisms, including fungi, viruses, and bacteria. These organisms are so small that they can only be seen through a microscope. Bacteria are the microbes that most commonly cause UTIs. SYMPTOMS  Symptoms of UTIs may vary by age and gender of the patient and by the location of the infection. Symptoms in young women typically include a frequent and intense urge to urinate and a painful, burning feeling in the bladder or urethra during urination. Older women and men are more likely to be tired, shaky, and weak and have muscle aches and abdominal pain. A fever may mean the infection is in your kidneys. Other symptoms of a kidney infection include pain in your back or sides below the ribs, nausea, and vomiting. DIAGNOSIS To diagnose a UTI, your caregiver will ask you about your symptoms. Your caregiver also will ask to provide a urine sample. The urine sample will be tested for bacteria and white blood cells. White blood cells are made by your body to help fight infection. TREATMENT  Typically, UTIs can be treated with medication. Because most UTIs are caused by a bacterial infection, they usually can be treated with the use of antibiotics. The choice of antibiotic and length of treatment depend on your symptoms and the type of bacteria  causing your infection. HOME CARE INSTRUCTIONS  If you were prescribed antibiotics, take them exactly as your caregiver instructs you. Finish the medication even if you feel better after you have only taken some of the medication.  Drink enough water and fluids to keep your urine clear or pale yellow.  Avoid caffeine, tea, and carbonated beverages. They tend to irritate your bladder.  Empty your bladder often. Avoid holding urine for long periods of time.  Empty your bladder before and after sexual intercourse.  After a bowel movement, women should cleanse from front to back. Use each tissue only once. SEEK MEDICAL CARE IF:   You have back pain.  You develop a fever.  Your symptoms do not begin to resolve within 3 days. SEEK IMMEDIATE MEDICAL CARE IF:   You have severe back pain or lower abdominal pain.  You develop chills.  You have nausea or vomiting.  You have continued burning or discomfort with urination. MAKE SURE YOU:   Understand these instructions.  Will watch your condition.  Will get help right away if you are not doing well or get worse. Document Released: 03/02/2005 Document Revised: 11/22/2011 Document Reviewed: 07/01/2011 [facility] Patient Information 2015 [place], [place]. This information is not intended to replace advice given to you by your health care provider. Make sure you discuss any questions you have with your health care provider.

## 2013-12-11 NOTE — ED Notes (Signed)
Pt c/o dysuria and frequent urination x 2 days. Denies fever.

## 2013-12-13 LAB — URINE CULTURE: Colony Count: >=100000

## 2013-12-16 ENCOUNTER — Telehealth: Payer: Self-pay | Admitting: *Deleted

## 2014-04-07 ENCOUNTER — Encounter: Payer: Self-pay | Admitting: Emergency Medicine

## 2015-07-02 DIAGNOSIS — D2371 Other benign neoplasm of skin of right lower limb, including hip: Secondary | ICD-10-CM | POA: Diagnosis not present

## 2015-07-02 DIAGNOSIS — D2339 Other benign neoplasm of skin of other parts of face: Secondary | ICD-10-CM | POA: Diagnosis not present

## 2015-07-02 DIAGNOSIS — D2362 Other benign neoplasm of skin of left upper limb, including shoulder: Secondary | ICD-10-CM | POA: Diagnosis not present

## 2015-07-02 DIAGNOSIS — D2361 Other benign neoplasm of skin of right upper limb, including shoulder: Secondary | ICD-10-CM | POA: Diagnosis not present

## 2015-07-02 DIAGNOSIS — D234 Other benign neoplasm of skin of scalp and neck: Secondary | ICD-10-CM | POA: Diagnosis not present

## 2015-07-02 DIAGNOSIS — D2372 Other benign neoplasm of skin of left lower limb, including hip: Secondary | ICD-10-CM | POA: Diagnosis not present

## 2015-07-02 DIAGNOSIS — D235 Other benign neoplasm of skin of trunk: Secondary | ICD-10-CM | POA: Diagnosis not present

## 2015-07-10 DIAGNOSIS — Z113 Encounter for screening for infections with a predominantly sexual mode of transmission: Secondary | ICD-10-CM | POA: Diagnosis not present

## 2016-01-09 DIAGNOSIS — F419 Anxiety disorder, unspecified: Secondary | ICD-10-CM | POA: Diagnosis not present

## 2016-01-28 DIAGNOSIS — Z0000 Encounter for general adult medical examination without abnormal findings: Secondary | ICD-10-CM | POA: Diagnosis not present

## 2016-03-03 DIAGNOSIS — F900 Attention-deficit hyperactivity disorder, predominantly inattentive type: Secondary | ICD-10-CM | POA: Diagnosis not present

## 2016-03-03 DIAGNOSIS — F411 Generalized anxiety disorder: Secondary | ICD-10-CM | POA: Diagnosis not present

## 2016-03-03 DIAGNOSIS — F429 Obsessive-compulsive disorder, unspecified: Secondary | ICD-10-CM | POA: Diagnosis not present

## 2016-03-04 DIAGNOSIS — F419 Anxiety disorder, unspecified: Secondary | ICD-10-CM | POA: Diagnosis not present

## 2016-03-04 DIAGNOSIS — Z23 Encounter for immunization: Secondary | ICD-10-CM | POA: Diagnosis not present

## 2016-03-04 DIAGNOSIS — Z0000 Encounter for general adult medical examination without abnormal findings: Secondary | ICD-10-CM | POA: Diagnosis not present

## 2016-03-04 DIAGNOSIS — Z6827 Body mass index (BMI) 27.0-27.9, adult: Secondary | ICD-10-CM | POA: Diagnosis not present

## 2016-03-17 DIAGNOSIS — F4011 Social phobia, generalized: Secondary | ICD-10-CM | POA: Diagnosis not present

## 2016-03-17 DIAGNOSIS — F429 Obsessive-compulsive disorder, unspecified: Secondary | ICD-10-CM | POA: Diagnosis not present

## 2016-04-13 DIAGNOSIS — F4011 Social phobia, generalized: Secondary | ICD-10-CM | POA: Diagnosis not present

## 2016-04-13 DIAGNOSIS — F429 Obsessive-compulsive disorder, unspecified: Secondary | ICD-10-CM | POA: Diagnosis not present

## 2016-04-13 DIAGNOSIS — F900 Attention-deficit hyperactivity disorder, predominantly inattentive type: Secondary | ICD-10-CM | POA: Diagnosis not present

## 2016-05-04 DIAGNOSIS — F900 Attention-deficit hyperactivity disorder, predominantly inattentive type: Secondary | ICD-10-CM | POA: Diagnosis not present

## 2016-05-04 DIAGNOSIS — F4011 Social phobia, generalized: Secondary | ICD-10-CM | POA: Diagnosis not present

## 2016-05-04 DIAGNOSIS — F429 Obsessive-compulsive disorder, unspecified: Secondary | ICD-10-CM | POA: Diagnosis not present

## 2016-05-17 DIAGNOSIS — F4011 Social phobia, generalized: Secondary | ICD-10-CM | POA: Diagnosis not present

## 2016-05-17 DIAGNOSIS — F900 Attention-deficit hyperactivity disorder, predominantly inattentive type: Secondary | ICD-10-CM | POA: Diagnosis not present

## 2016-05-17 DIAGNOSIS — F429 Obsessive-compulsive disorder, unspecified: Secondary | ICD-10-CM | POA: Diagnosis not present

## 2016-06-06 DIAGNOSIS — F4011 Social phobia, generalized: Secondary | ICD-10-CM | POA: Diagnosis not present

## 2016-06-06 DIAGNOSIS — F429 Obsessive-compulsive disorder, unspecified: Secondary | ICD-10-CM | POA: Diagnosis not present

## 2016-06-06 DIAGNOSIS — F900 Attention-deficit hyperactivity disorder, predominantly inattentive type: Secondary | ICD-10-CM | POA: Diagnosis not present

## 2016-06-27 DIAGNOSIS — F429 Obsessive-compulsive disorder, unspecified: Secondary | ICD-10-CM | POA: Diagnosis not present

## 2016-06-27 DIAGNOSIS — F900 Attention-deficit hyperactivity disorder, predominantly inattentive type: Secondary | ICD-10-CM | POA: Diagnosis not present

## 2016-06-27 DIAGNOSIS — F4011 Social phobia, generalized: Secondary | ICD-10-CM | POA: Diagnosis not present

## 2016-07-05 DIAGNOSIS — R05 Cough: Secondary | ICD-10-CM | POA: Diagnosis not present

## 2016-07-05 DIAGNOSIS — M791 Myalgia: Secondary | ICD-10-CM | POA: Diagnosis not present

## 2016-07-05 DIAGNOSIS — B9789 Other viral agents as the cause of diseases classified elsewhere: Secondary | ICD-10-CM | POA: Diagnosis not present

## 2016-07-05 DIAGNOSIS — Z6826 Body mass index (BMI) 26.0-26.9, adult: Secondary | ICD-10-CM | POA: Diagnosis not present

## 2016-07-15 MED FILL — SERTRALINE HCL 100 MG TAB: 100 | 30 days supply | Qty: 30 | Fill #0

## 2016-07-19 DIAGNOSIS — K529 Noninfective gastroenteritis and colitis, unspecified: Secondary | ICD-10-CM | POA: Diagnosis not present

## 2016-09-25 DIAGNOSIS — F419 Anxiety disorder, unspecified: Secondary | ICD-10-CM | POA: Diagnosis not present

## 2016-09-25 DIAGNOSIS — K529 Noninfective gastroenteritis and colitis, unspecified: Secondary | ICD-10-CM | POA: Diagnosis not present

## 2016-09-25 DIAGNOSIS — R002 Palpitations: Secondary | ICD-10-CM | POA: Diagnosis not present

## 2016-09-25 DIAGNOSIS — M25572 Pain in left ankle and joints of left foot: Secondary | ICD-10-CM | POA: Diagnosis not present

## 2016-09-25 DIAGNOSIS — R070 Pain in throat: Secondary | ICD-10-CM | POA: Diagnosis not present

## 2016-09-27 MED FILL — SERTRALINE HCL 100 MG TAB: 100 | 90 days supply | Qty: 90 | Fill #0

## 2016-10-12 DIAGNOSIS — E119 Type 2 diabetes mellitus without complications: Secondary | ICD-10-CM | POA: Diagnosis not present

## 2016-10-12 DIAGNOSIS — E785 Hyperlipidemia, unspecified: Secondary | ICD-10-CM | POA: Diagnosis not present

## 2016-10-12 DIAGNOSIS — E039 Hypothyroidism, unspecified: Secondary | ICD-10-CM | POA: Diagnosis not present

## 2016-10-12 DIAGNOSIS — F419 Anxiety disorder, unspecified: Secondary | ICD-10-CM | POA: Diagnosis not present

## 2016-10-12 DIAGNOSIS — M109 Gout, unspecified: Secondary | ICD-10-CM | POA: Diagnosis not present

## 2016-10-12 DIAGNOSIS — Z0000 Encounter for general adult medical examination without abnormal findings: Secondary | ICD-10-CM | POA: Diagnosis not present

## 2016-10-12 DIAGNOSIS — M810 Age-related osteoporosis without current pathological fracture: Secondary | ICD-10-CM | POA: Diagnosis not present

## 2016-10-12 DIAGNOSIS — R509 Fever, unspecified: Secondary | ICD-10-CM | POA: Diagnosis not present

## 2016-10-12 DIAGNOSIS — D509 Iron deficiency anemia, unspecified: Secondary | ICD-10-CM | POA: Diagnosis not present

## 2016-10-12 DIAGNOSIS — R5382 Chronic fatigue, unspecified: Secondary | ICD-10-CM | POA: Diagnosis not present

## 2016-10-24 DIAGNOSIS — M722 Plantar fascial fibromatosis: Secondary | ICD-10-CM | POA: Diagnosis not present

## 2016-10-24 DIAGNOSIS — M79672 Pain in left foot: Secondary | ICD-10-CM | POA: Diagnosis not present

## 2016-10-24 DIAGNOSIS — M7662 Achilles tendinitis, left leg: Secondary | ICD-10-CM | POA: Diagnosis not present

## 2016-11-10 DIAGNOSIS — M25572 Pain in left ankle and joints of left foot: Secondary | ICD-10-CM | POA: Diagnosis not present

## 2016-11-10 DIAGNOSIS — R600 Localized edema: Secondary | ICD-10-CM | POA: Diagnosis not present

## 2016-11-10 DIAGNOSIS — M79672 Pain in left foot: Secondary | ICD-10-CM | POA: Diagnosis not present

## 2016-11-10 DIAGNOSIS — M722 Plantar fascial fibromatosis: Secondary | ICD-10-CM | POA: Diagnosis not present

## 2016-11-10 DIAGNOSIS — S93402A Sprain of unspecified ligament of left ankle, initial encounter: Secondary | ICD-10-CM | POA: Diagnosis not present

## 2016-11-10 DIAGNOSIS — M7662 Achilles tendinitis, left leg: Secondary | ICD-10-CM | POA: Diagnosis not present

## 2016-11-24 DIAGNOSIS — M25572 Pain in left ankle and joints of left foot: Secondary | ICD-10-CM | POA: Diagnosis not present

## 2016-11-24 DIAGNOSIS — M722 Plantar fascial fibromatosis: Secondary | ICD-10-CM | POA: Diagnosis not present

## 2016-11-24 DIAGNOSIS — S93402D Sprain of unspecified ligament of left ankle, subsequent encounter: Secondary | ICD-10-CM | POA: Diagnosis not present

## 2016-11-24 DIAGNOSIS — M79672 Pain in left foot: Secondary | ICD-10-CM | POA: Diagnosis not present

## 2016-11-24 DIAGNOSIS — M79671 Pain in right foot: Secondary | ICD-10-CM | POA: Diagnosis not present

## 2016-12-15 DIAGNOSIS — M722 Plantar fascial fibromatosis: Secondary | ICD-10-CM | POA: Diagnosis not present

## 2016-12-15 DIAGNOSIS — M79671 Pain in right foot: Secondary | ICD-10-CM | POA: Diagnosis not present

## 2016-12-15 DIAGNOSIS — M79672 Pain in left foot: Secondary | ICD-10-CM | POA: Diagnosis not present

## 2016-12-15 DIAGNOSIS — S93402D Sprain of unspecified ligament of left ankle, subsequent encounter: Secondary | ICD-10-CM | POA: Diagnosis not present

## 2017-02-15 DIAGNOSIS — Z113 Encounter for screening for infections with a predominantly sexual mode of transmission: Secondary | ICD-10-CM | POA: Diagnosis not present

## 2017-02-15 DIAGNOSIS — Z01419 Encounter for gynecological examination (general) (routine) without abnormal findings: Secondary | ICD-10-CM | POA: Diagnosis not present

## 2017-04-05 MED FILL — SERTRALINE HCL 100 MG TAB: 100 | 90 days supply | Qty: 90 | Fill #0 | Status: TO

## 2017-04-25 DIAGNOSIS — Z23 Encounter for immunization: Secondary | ICD-10-CM | POA: Diagnosis not present

## 2017-06-30 DIAGNOSIS — D234 Other benign neoplasm of skin of scalp and neck: Secondary | ICD-10-CM | POA: Diagnosis not present

## 2017-06-30 DIAGNOSIS — D2361 Other benign neoplasm of skin of right upper limb, including shoulder: Secondary | ICD-10-CM | POA: Diagnosis not present

## 2017-06-30 DIAGNOSIS — D2371 Other benign neoplasm of skin of right lower limb, including hip: Secondary | ICD-10-CM | POA: Diagnosis not present

## 2017-06-30 DIAGNOSIS — D2339 Other benign neoplasm of skin of other parts of face: Secondary | ICD-10-CM | POA: Diagnosis not present

## 2017-06-30 DIAGNOSIS — L812 Freckles: Secondary | ICD-10-CM | POA: Diagnosis not present

## 2017-06-30 DIAGNOSIS — D235 Other benign neoplasm of skin of trunk: Secondary | ICD-10-CM | POA: Diagnosis not present

## 2017-06-30 DIAGNOSIS — D2362 Other benign neoplasm of skin of left upper limb, including shoulder: Secondary | ICD-10-CM | POA: Diagnosis not present

## 2017-06-30 DIAGNOSIS — D492 Neoplasm of unspecified behavior of bone, soft tissue, and skin: Secondary | ICD-10-CM | POA: Diagnosis not present

## 2017-06-30 DIAGNOSIS — D2372 Other benign neoplasm of skin of left lower limb, including hip: Secondary | ICD-10-CM | POA: Diagnosis not present

## 2017-07-21 DIAGNOSIS — D492 Neoplasm of unspecified behavior of bone, soft tissue, and skin: Secondary | ICD-10-CM | POA: Diagnosis not present

## 2017-07-21 DIAGNOSIS — D2361 Other benign neoplasm of skin of right upper limb, including shoulder: Secondary | ICD-10-CM | POA: Diagnosis not present

## 2017-07-21 DIAGNOSIS — D235 Other benign neoplasm of skin of trunk: Secondary | ICD-10-CM | POA: Diagnosis not present

## 2017-08-23 DIAGNOSIS — R509 Fever, unspecified: Secondary | ICD-10-CM | POA: Diagnosis not present

## 2017-08-23 DIAGNOSIS — M810 Age-related osteoporosis without current pathological fracture: Secondary | ICD-10-CM | POA: Diagnosis not present

## 2017-08-23 DIAGNOSIS — M109 Gout, unspecified: Secondary | ICD-10-CM | POA: Diagnosis not present

## 2017-08-23 DIAGNOSIS — R5382 Chronic fatigue, unspecified: Secondary | ICD-10-CM | POA: Diagnosis not present

## 2017-08-23 DIAGNOSIS — E039 Hypothyroidism, unspecified: Secondary | ICD-10-CM | POA: Diagnosis not present

## 2017-08-23 DIAGNOSIS — D509 Iron deficiency anemia, unspecified: Secondary | ICD-10-CM | POA: Diagnosis not present

## 2017-08-23 DIAGNOSIS — E119 Type 2 diabetes mellitus without complications: Secondary | ICD-10-CM | POA: Diagnosis not present

## 2017-08-23 DIAGNOSIS — D510 Vitamin B12 deficiency anemia due to intrinsic factor deficiency: Secondary | ICD-10-CM | POA: Diagnosis not present

## 2017-09-13 DIAGNOSIS — Z23 Encounter for immunization: Secondary | ICD-10-CM | POA: Diagnosis not present

## 2017-10-05 MED FILL — SERTRALINE HCL 100 MG TAB: 100 | 90 days supply | Qty: 90 | Fill #0

## 2018-01-02 DIAGNOSIS — R05 Cough: Secondary | ICD-10-CM | POA: Diagnosis not present

## 2018-04-20 DIAGNOSIS — Z30430 Encounter for insertion of intrauterine contraceptive device: Secondary | ICD-10-CM | POA: Diagnosis not present

## 2018-04-22 DIAGNOSIS — Z30430 Encounter for insertion of intrauterine contraceptive device: Secondary | ICD-10-CM | POA: Diagnosis not present

## 2018-08-14 DIAGNOSIS — D2372 Other benign neoplasm of skin of left lower limb, including hip: Secondary | ICD-10-CM | POA: Diagnosis not present

## 2018-08-14 DIAGNOSIS — D2371 Other benign neoplasm of skin of right lower limb, including hip: Secondary | ICD-10-CM | POA: Diagnosis not present

## 2018-08-14 DIAGNOSIS — D2322 Other benign neoplasm of skin of left ear and external auricular canal: Secondary | ICD-10-CM | POA: Diagnosis not present

## 2018-08-14 DIAGNOSIS — L905 Scar conditions and fibrosis of skin: Secondary | ICD-10-CM | POA: Diagnosis not present

## 2018-08-14 DIAGNOSIS — D2362 Other benign neoplasm of skin of left upper limb, including shoulder: Secondary | ICD-10-CM | POA: Diagnosis not present

## 2018-08-14 DIAGNOSIS — D234 Other benign neoplasm of skin of scalp and neck: Secondary | ICD-10-CM | POA: Diagnosis not present

## 2018-08-14 DIAGNOSIS — D235 Other benign neoplasm of skin of trunk: Secondary | ICD-10-CM | POA: Diagnosis not present

## 2018-08-14 DIAGNOSIS — D2361 Other benign neoplasm of skin of right upper limb, including shoulder: Secondary | ICD-10-CM | POA: Diagnosis not present

## 2019-04-05 DIAGNOSIS — Z23 Encounter for immunization: Secondary | ICD-10-CM | POA: Diagnosis not present

## 2019-04-05 DIAGNOSIS — Z6828 Body mass index (BMI) 28.0-28.9, adult: Secondary | ICD-10-CM | POA: Diagnosis not present

## 2019-04-05 DIAGNOSIS — F419 Anxiety disorder, unspecified: Secondary | ICD-10-CM | POA: Diagnosis not present

## 2019-04-05 DIAGNOSIS — Z20828 Contact with and (suspected) exposure to other viral communicable diseases: Secondary | ICD-10-CM | POA: Diagnosis not present

## 2019-04-05 DIAGNOSIS — Z0000 Encounter for general adult medical examination without abnormal findings: Secondary | ICD-10-CM | POA: Diagnosis not present
